# Patient Record
Sex: Male | Born: 1995 | Race: White | Hispanic: No | Marital: Single | Smoking: Never smoker
Health system: Southern US, Community
[De-identification: ages and names within clinical notes are randomized; demographics above are authoritative.]

## PROBLEM LIST (undated history)

## (undated) DIAGNOSIS — L309 Dermatitis, unspecified: Secondary | ICD-10-CM

## (undated) DIAGNOSIS — R300 Dysuria: Secondary | ICD-10-CM

## (undated) HISTORY — DX: Dysuria: R30.0

## (undated) HISTORY — DX: Dermatitis, unspecified: L30.9

---
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
## 2016-10-03 ENCOUNTER — Ambulatory Visit: Payer: BLUE CROSS/BLUE SHIELD | Admitting: Urology

## 2016-10-09 NOTE — Progress Notes (Signed)
10/10/2016 9:35 PM   Jeffrey Larsen 04-13-1995 [id number]  Referring provider: Viviano Simas, FNP [address] [place], [place] [address]  Chief Complaint  Patient presents with  . New Patient (Initial Visit)    Dysuria referred by Viviano Simas FNP    HPI: Patient is a 21 year old Caucasian male who is referred by [name] FNP for intermittent pain with erections and after urination for the last 4 years.  He states that in his junior high school year he had an episode of gross hematuria associated with dysuria. He states that he sought treatment with his primary care physician and was told that he was most likely passing a kidney stone. He never passed a stone and the symptoms abated.   One year ago, he started experience pain with urination.  He was experiencing cloudy urine but did not have gross hematuria.  The pain with urination has become more frequent and more intense.  He was also having pain with erections.  The pain with the erections have since abated about two weeks ago.    His UA today was positive for ketones and protein.  His PVR is 33 mL.    He is not experiencing gross hematuria or suprapubic pain at this time.  He is not having spraying of the urinary stream.  He is not having fevers, chills, nausea or vomiting.    Reviewed referral notes - STI's testing negative  PMH: Past Medical History:  Diagnosis Date  . Dysuria     Surgical History: History reviewed. No pertinent surgical history.  Home Medications:  Allergies as of 10/10/2016      Reactions   Other Itching   pollen   Sulfa Antibiotics       Medication List       Accurate as of 10/10/16 11:59 PM. Always use your most recent med list.          cetirizine 10 MG tablet Commonly known as:  ZYRTEC Take by mouth.   lisdexamfetamine 30 MG capsule Commonly known as:  VYVANSE Take by mouth.   montelukast 10 MG tablet Commonly known as:  SINGULAIR Take 10 mg by mouth at bedtime.     promethazine 25 MG tablet Commonly known as:  PHENERGAN Take 1 tablet (25 mg total) by mouth every 6 (six) hours as needed for nausea or vomiting.       Allergies:  Allergies  Allergen Reactions  . Other Itching    pollen  . Sulfa Antibiotics     Family History: Family History  Problem Relation Age of Onset  . Kidney cancer Father   . Bladder Cancer Neg Hx   . Prostate cancer Neg Hx     Social History:  reports that he has never smoked. He has never used smokeless tobacco. He reports that he drinks alcohol. He reports that he does not use drugs.  ROS: UROLOGY Frequent Urination?: No Hard to postpone urination?: No Burning/pain with urination?: Yes Get up at night to urinate?: No Leakage of urine?: No Urine stream starts and stops?: No Trouble starting stream?: Yes Do you have to strain to urinate?: Yes Blood in urine?: No Urinary tract infection?: No Sexually transmitted disease?: No Injury to kidneys or bladder?: No Painful intercourse?: No Weak stream?: No Erection problems?: No Penile pain?: Yes  Gastrointestinal Nausea?: No Vomiting?: No Indigestion/heartburn?: No Diarrhea?: No Constipation?: No  Constitutional Fever: No Night sweats?: No Weight loss?: No Fatigue?: No  Skin Skin rash/lesions?: No Itching?:  No  Eyes Blurred vision?: No Double vision?: No  Ears/Nose/Throat Sore throat?: No Sinus problems?: No  Hematologic/Lymphatic Swollen glands?: No Easy bruising?: No  Cardiovascular Leg swelling?: No Chest pain?: No  Respiratory Cough?: No Shortness of breath?: No  Endocrine Excessive thirst?: No  Musculoskeletal Back pain?: No Joint pain?: No  Neurological Headaches?: No Dizziness?: No  Psychologic Depression?: No Anxiety?: No  Physical Exam: BP 132/83   Pulse 88   Ht 6' 1.5" (1.867 m)   Wt 218 lb 1.6 oz (98.9 kg)   BMI 28.38 kg/m   Constitutional: Well nourished. Alert and oriented, No acute  distress. HEENT: [place] AT, moist mucus membranes. Trachea midline, no masses. Cardiovascular: No clubbing, cyanosis, or edema. Respiratory: Normal respiratory effort, no increased work of breathing. GI: Abdomen is soft, non tender, non distended, no abdominal masses. Liver and spleen not palpable.  No hernias appreciated.  Stool sample for occult testing is not indicated.   GU: No CVA tenderness.  No bladder fullness or masses.  Patient with circumcised phallus.  Urethral meatus is patent.  No penile discharge. No penile lesions or rashes. Scrotum without lesions, cysts, rashes and/or edema.  Testicles are located scrotally bilaterally. No masses are appreciated in the testicles. Left and right epididymis are normal. Rectal: Patient with  normal sphincter tone. Anus and perineum without scarring or rashes. No rectal masses are appreciated. Prostate is approximately 35 grams, no nodules are appreciated. Seminal vesicles are normal. Skin: No rashes, bruises or suspicious lesions. Lymph: No cervical or inguinal adenopathy. Neurologic: Grossly intact, no focal deficits, moving all 4 extremities. Psychiatric: Normal mood and affect.  Laboratory Data: Urinalysis Microscopically negative.  See EPIC  I have reviewed the labs.   Pertinent Imaging: Results for HUEL, CENTOLA (MRN [id number]) as of 10/20/2016 21:31  Ref. Range 10/10/2016 16:02  Scan Result Unknown 33   I have independently reviewed the films.    Assessment & Plan:    1. Dysuria  - patient with a possible history of nephrolithiasis - will obtain a CT Renal stone study to rule out stone as a cause of urinary symptoms  - RTC for report  2. Painful erections  - resolved for the time being  - will continue to monitor  3. History of hematuria  - CT Renal stone study pending    Return for CT Renal stone study report.  These notes generated with voice recognition software. I apologize for typographical errors.  Jeffrey Cowboy,  PA-C  [facility] [address] [place], [place] [address] [phone]

## 2016-10-10 ENCOUNTER — Ambulatory Visit (INDEPENDENT_AMBULATORY_CARE_PROVIDER_SITE_OTHER): Payer: BLUE CROSS/BLUE SHIELD | Admitting: Urology

## 2016-10-10 ENCOUNTER — Encounter: Payer: Self-pay | Admitting: Urology

## 2016-10-10 VITALS — BP 132/83 | HR 88 | Ht 73.5 in | Wt 218.1 lb

## 2016-10-10 DIAGNOSIS — N4830 Priapism, unspecified: Secondary | ICD-10-CM

## 2016-10-10 DIAGNOSIS — Z87448 Personal history of other diseases of urinary system: Secondary | ICD-10-CM | POA: Diagnosis not present

## 2016-10-10 DIAGNOSIS — R300 Dysuria: Secondary | ICD-10-CM

## 2016-10-10 LAB — URINALYSIS, COMPLETE
Bilirubin, UA: NEGATIVE
GLUCOSE, UA: NEGATIVE
Leukocytes, UA: NEGATIVE
Nitrite, UA: NEGATIVE
Specific Gravity, UA: 1.02 (ref 1.005–1.030)
Urobilinogen, Ur: 0.2 mg/dL (ref 0.2–1.0)
pH, UA: 6.5 (ref 5.0–7.5)

## 2016-10-10 LAB — BLADDER SCAN AMB NON-IMAGING: Scan Result: 33

## 2016-10-16 ENCOUNTER — Telehealth: Payer: Self-pay

## 2016-10-16 LAB — CULTURE, URINE COMPREHENSIVE

## 2016-10-16 NOTE — Telephone Encounter (Signed)
Spoke with pt in reference to ucx results. Pt voiced understanding.  

## 2016-10-16 NOTE — Telephone Encounter (Signed)
-----   Message from [name], PA-C sent at 10/16/2016  7:39 AM EDT ----- Please let Jeffrey Larsen know that his urine culture was negative.

## 2016-10-20 ENCOUNTER — Encounter: Payer: Self-pay | Admitting: Urology

## 2016-10-25 ENCOUNTER — Ambulatory Visit
Admission: RE | Admit: 2016-10-25 | Discharge: 2016-10-25 | Disposition: A | Discharge status: Home / Self Care | Payer: BLUE CROSS/BLUE SHIELD | Source: Ambulatory Visit | Attending: Urology | Admitting: Urology

## 2016-10-25 DIAGNOSIS — Z87448 Personal history of other diseases of urinary system: Secondary | ICD-10-CM | POA: Insufficient documentation

## 2016-10-25 DIAGNOSIS — R300 Dysuria: Secondary | ICD-10-CM | POA: Diagnosis not present

## 2016-10-29 NOTE — Progress Notes (Signed)
10/30/2016 4:37 PM   Maudie Flakes December 05, 1995 [id number]  Referring provider: No referring provider defined for this encounter.  Chief Complaint  Patient presents with  . Results    HPI: 21 yo WM who presents today to discuss his CT Renal stone study report with his mother, Fleet Contras.    Background history Patient is a 21 year old Caucasian male who is referred by [name] FNP for intermittent pain with erections and after urination for the last 4 years.  He states that in his junior high school year he had an episode of gross hematuria associated with dysuria. He sought treatment with his primary care physician and was told that he was most likely passing a kidney stone. He never passed a stone and the symptoms abated.    One year ago, he started experience pain with urination.  He was experiencing cloudy urine but did not have gross hematuria.  These symptoms had been infrequent until recently when the pain with urination became more frequent and more intense.  He was also having pain with erections, but two weeks ago, the pain with the erections abated.  His UA was positive for ketones and protein.  Urine culture was negative.  His PVR was 33 mL.  He is not experiencing gross hematuria or suprapubic pain at this time.  He is not having spraying of the urinary stream.  He is not having fevers, chills, nausea or vomiting.  Reviewed referral notes - STI's testing negative  Today, he states his symptoms remain at baseline.  He has not had gross hematuria or suprapubic pain.  He has not had fevers, chills, nausea or vomiting.   Nothing makes the symptoms worse or better.    CT Renal stone study performed on 10/25/2016 noted no renal or ureteral obstructing stone or hydronephrosis. Slightly lobulated contour of the kidneys. Taking into account limitation by non contrast imaging, no renal or adrenal lesion. Noncontrast filled views of the urinary bladder  Unremarkable.    PMH: Past Medical  History:  Diagnosis Date  . Dysuria   . Eczema     Surgical History: No past surgical history on file.  Home Medications:  Allergies as of 10/30/2016      Reactions   Other Itching   pollen   Sulfa Antibiotics       Medication List       Accurate as of 10/30/16  4:37 PM. Always use your most recent med list.          cetirizine 10 MG tablet Commonly known as:  ZYRTEC Take by mouth.   diazepam 10 MG tablet Commonly known as:  VALIUM Take 30 minutes prior to the cystoscopy   lisdexamfetamine 30 MG capsule Commonly known as:  VYVANSE Take by mouth.   montelukast 10 MG tablet Commonly known as:  SINGULAIR Take 10 mg by mouth at bedtime.   promethazine 25 MG tablet Commonly known as:  PHENERGAN Take 1 tablet (25 mg total) by mouth every 6 (six) hours as needed for nausea or vomiting.       Allergies:  Allergies  Allergen Reactions  . Other Itching    pollen  . Sulfa Antibiotics     Family History: Family History  Problem Relation Age of Onset  . Kidney cancer Father   . Bladder Cancer Neg Hx   . Prostate cancer Neg Hx     Social History:  reports that he has never smoked. He has never used smokeless tobacco. He  reports that he drinks alcohol. He reports that he does not use drugs.  ROS: UROLOGY Frequent Urination?: No Hard to postpone urination?: No Burning/pain with urination?: No Get up at night to urinate?: No Leakage of urine?: No Urine stream starts and stops?: No Trouble starting stream?: No Do you have to strain to urinate?: No Blood in urine?: No Urinary tract infection?: No Sexually transmitted disease?: No Injury to kidneys or bladder?: No Painful intercourse?: No Weak stream?: No Erection problems?: No Penile pain?: Yes  Gastrointestinal Nausea?: No Vomiting?: No Indigestion/heartburn?: No Diarrhea?: No Constipation?: No  Constitutional Fever: No Night sweats?: No Weight loss?: No Fatigue?: No  Skin Skin  rash/lesions?: No Itching?: No  Eyes Blurred vision?: No Double vision?: No  Ears/Nose/Throat Sore throat?: No Sinus problems?: No  Hematologic/Lymphatic Swollen glands?: No Easy bruising?: No  Cardiovascular Leg swelling?: No Chest pain?: No  Respiratory Cough?: No Shortness of breath?: No  Endocrine Excessive thirst?: No  Musculoskeletal Back pain?: No Joint pain?: No  Neurological Headaches?: No Dizziness?: No  Psychologic Depression?: No Anxiety?: No  Physical Exam: There were no vitals taken for this visit.  Constitutional: Well nourished. Alert and oriented, No acute distress. HEENT: [place] AT, moist mucus membranes. Trachea midline, no masses. Cardiovascular: No clubbing, cyanosis, or edema. Respiratory: Normal respiratory effort, no increased work of breathing. Skin: No rashes, bruises or suspicious lesions. Lymph: No cervical or inguinal adenopathy. Neurologic: Grossly intact, no focal deficits, moving all 4 extremities. Psychiatric: Normal mood and affect.  Laboratory Data: Results for orders placed or performed in visit on 10/10/16  CULTURE, URINE COMPREHENSIVE  Result Value Ref Range   Urine Culture, Comprehensive Final report    Organism ID, Bacteria Comment   Urinalysis, Complete  Result Value Ref Range   Specific Gravity, UA 1.020 1.005 - 1.030   pH, UA 6.5 5.0 - 7.5   Color, UA Yellow Yellow   Appearance Ur Clear Clear   Leukocytes, UA Negative Negative   Protein, UA 1+ (A) Negative/Trace   Glucose, UA Negative Negative   Ketones, UA 1+ (A) Negative   RBC, UA Trace (A) Negative   Bilirubin, UA Negative Negative   Urobilinogen, Ur 0.2 0.2 - 1.0 mg/dL   Nitrite, UA Negative Negative  BLADDER SCAN AMB NON-IMAGING  Result Value Ref Range   Scan Result 33    I have reviewed the labs.   Pertinent Imaging: CLINICAL DATA:  21 year old male with remote history of blood in urine and flank pain. Similar symptoms started in March and  are intermittent. Currently no abdominal pain, blood in urine or flank pain. Initial encounter.  EXAM: CT ABDOMEN AND PELVIS WITHOUT CONTRAST  TECHNIQUE: Multidetector CT imaging of the abdomen and pelvis was performed following the standard protocol without IV contrast.  COMPARISON:  None.  FINDINGS: Lower chest: Clear.  Hepatobiliary: Taking into account limitation by non contrast imaging, no worrisome hepatic lesion. No calcified gallstone.  Pancreas: Taking into account limitation by non contrast imaging, no pancreatic mass or inflammation.  Spleen: Taking into account limitation by non contrast imaging, no splenic mass or enlargement.  Adrenals/Urinary Tract: No renal or ureteral obstructing stone or hydronephrosis. Slightly lobulated contour of the kidneys. Taking into account limitation by non contrast imaging, no renal or adrenal lesion. Noncontrast filled views of the urinary bladder unremarkable.  Stomach/Bowel: Portions of stomach and bowel under distended. No extraluminal bowel inflammatory process, free fluid or free air.  Vascular/Lymphatic: No aortic aneurysm. Scattered normal size lymph nodes without  adenopathy.  Reproductive: Negative  Other: No bowel containing hernia.  Musculoskeletal: No worrisome osseous abnormality.  IMPRESSION: No renal or ureteral obstructing stone or evidence hydronephrosis.  Remainder of exam unremarkable as noted above.   Electronically Signed   By: Lacy Duverney M.D.   On: 10/25/2016 16:34  I have independently reviewed the films.    Assessment & Plan:    1. Dysuria  - no etiology found on CT for dysuria  - will schedule cystoscopy to rule out urethral strictures or other etiologies   - Valium 10 mg given to take 30 minutes prior to procedure  - explained to the patient that the cystoscopy consists of passing a tube with a lens up through their urethra and into their urinary bladder.    - We  will inject the urethra with a lidocaine gel prior to introducing the cystoscope to help with any discomfort during the procedure.    - advised the patient that after the procedure, they might experience blood in the urine and discomfort with urination.  This will abate after the first few voids.  I have  encouraged the patient to increase water intake  during this time.  Patient denies any allergies to lidocaine.    2. Painful erections  - resolved for the time being  - will continue to monitor  3. History of hematuria  - CT Renal stone study negative for nephrolithiasis  4. Lobulated contour of the kidneys  - will obtain a RUS for further evaluation  - RTC for report    Return for RUS report and cystoscopy .  These notes generated with voice recognition software. I apologize for typographical errors.  Michiel Cowboy, PA-C  [facility] [address], [address] [place], [place] [address] [phone]

## 2016-10-30 ENCOUNTER — Ambulatory Visit (INDEPENDENT_AMBULATORY_CARE_PROVIDER_SITE_OTHER): Payer: BLUE CROSS/BLUE SHIELD | Admitting: Urology

## 2016-10-30 ENCOUNTER — Encounter: Payer: Self-pay | Admitting: Urology

## 2016-10-30 DIAGNOSIS — R300 Dysuria: Secondary | ICD-10-CM | POA: Diagnosis not present

## 2016-10-30 DIAGNOSIS — Z87448 Personal history of other diseases of urinary system: Secondary | ICD-10-CM | POA: Diagnosis not present

## 2016-10-30 DIAGNOSIS — Q631 Lobulated, fused and horseshoe kidney: Secondary | ICD-10-CM

## 2016-10-30 DIAGNOSIS — N4830 Priapism, unspecified: Secondary | ICD-10-CM | POA: Diagnosis not present

## 2016-10-30 MED ORDER — DIAZEPAM 10 MG PO TABS: ORAL_TABLET | ORAL | 0 refills | Status: DC

## 2016-11-13 ENCOUNTER — Ambulatory Visit
Admission: RE | Admit: 2016-11-13 | Discharge: 2016-11-13 | Disposition: A | Discharge status: Home / Self Care | Payer: BLUE CROSS/BLUE SHIELD | Source: Ambulatory Visit | Attending: Urology | Admitting: Urology

## 2016-11-13 DIAGNOSIS — N2889 Other specified disorders of kidney and ureter: Secondary | ICD-10-CM | POA: Insufficient documentation

## 2016-11-13 DIAGNOSIS — Z87448 Personal history of other diseases of urinary system: Secondary | ICD-10-CM

## 2016-11-22 ENCOUNTER — Encounter: Payer: Self-pay | Admitting: Urology

## 2016-11-22 ENCOUNTER — Ambulatory Visit (INDEPENDENT_AMBULATORY_CARE_PROVIDER_SITE_OTHER): Payer: BLUE CROSS/BLUE SHIELD | Admitting: Urology

## 2016-11-22 VITALS — BP 124/81 | HR 90 | Ht 73.5 in | Wt 223.0 lb

## 2016-11-22 DIAGNOSIS — R300 Dysuria: Secondary | ICD-10-CM | POA: Diagnosis not present

## 2016-11-22 MED ORDER — LIDOCAINE HCL 2 % EX GEL
1.0000 "application " | Freq: Once | CUTANEOUS | Status: AC
  Administered 2016-11-22: 1 via URETHRAL

## 2016-11-22 MED ORDER — CIPROFLOXACIN HCL 500 MG PO TABS
500.0000 mg | ORAL_TABLET | Freq: Once | ORAL | Status: AC
  Administered 2016-11-22: 500 mg via ORAL

## 2016-11-22 NOTE — Progress Notes (Signed)
   11/22/16  CC:  Chief Complaint  Patient presents with  . Cysto    HPI: Refer to office note of 10/30/2016.  Follow-up renal ultrasound showed no renal masses.  Blood pressure 124/81, pulse 90, height 6' 1.5" (1.867 m), weight 223 lb (101.2 kg). NED. A&Ox3.   No respiratory distress   Abd soft, NT, ND Normal phallus with bilateral descended testicles  Cystoscopy Procedure Note  Patient identification was confirmed, informed consent was obtained, and patient was prepped using Betadine solution.  Lidocaine jelly was administered per urethral meatus.    Preoperative abx where received prior to procedure.     Pre-Procedure: - Inspection reveals a normal caliber ureteral meatus.  Procedure: The flexible cystoscope was introduced without difficulty - No urethral strictures/lesions are present. - Normal prostate  - Normal bladder neck - Bilateral ureteral orifices identified - Bladder mucosa  reveals no ulcers, tumors, or lesions - No bladder stones - No trabeculation  Retroflexion shows no abnormalities   Post-Procedure: - Patient tolerated the procedure well  Assessment/ Plan:  No evidence of urethral stricture.  Follow-up as needed for any significant change in symptoms

## 2016-11-23 LAB — MICROSCOPIC EXAMINATION
Bacteria, UA: NONE SEEN
EPITHELIAL CELLS (NON RENAL): NONE SEEN /HPF (ref 0–10)
WBC, UA: NONE SEEN /hpf (ref 0–?)

## 2016-11-23 LAB — URINALYSIS, COMPLETE
BILIRUBIN UA: NEGATIVE
Glucose, UA: NEGATIVE
Ketones, UA: NEGATIVE
Leukocytes, UA: NEGATIVE
NITRITE UA: NEGATIVE
PH UA: 6.5 (ref 5.0–7.5)
Specific Gravity, UA: 1.025 (ref 1.005–1.030)
UUROB: 0.2 mg/dL (ref 0.2–1.0)

## 2017-03-25 IMAGING — CR DG CHEST 2V
1 series · 2 of 2 positions shown · non-contrast
Comparison: None.

CLINICAL DATA: Cough

EXAM:
CHEST  2 VIEW

[Series 1: dg chest 2 view · 0.14mm/px · 2 of 2 slices shown]
[im 1/2]
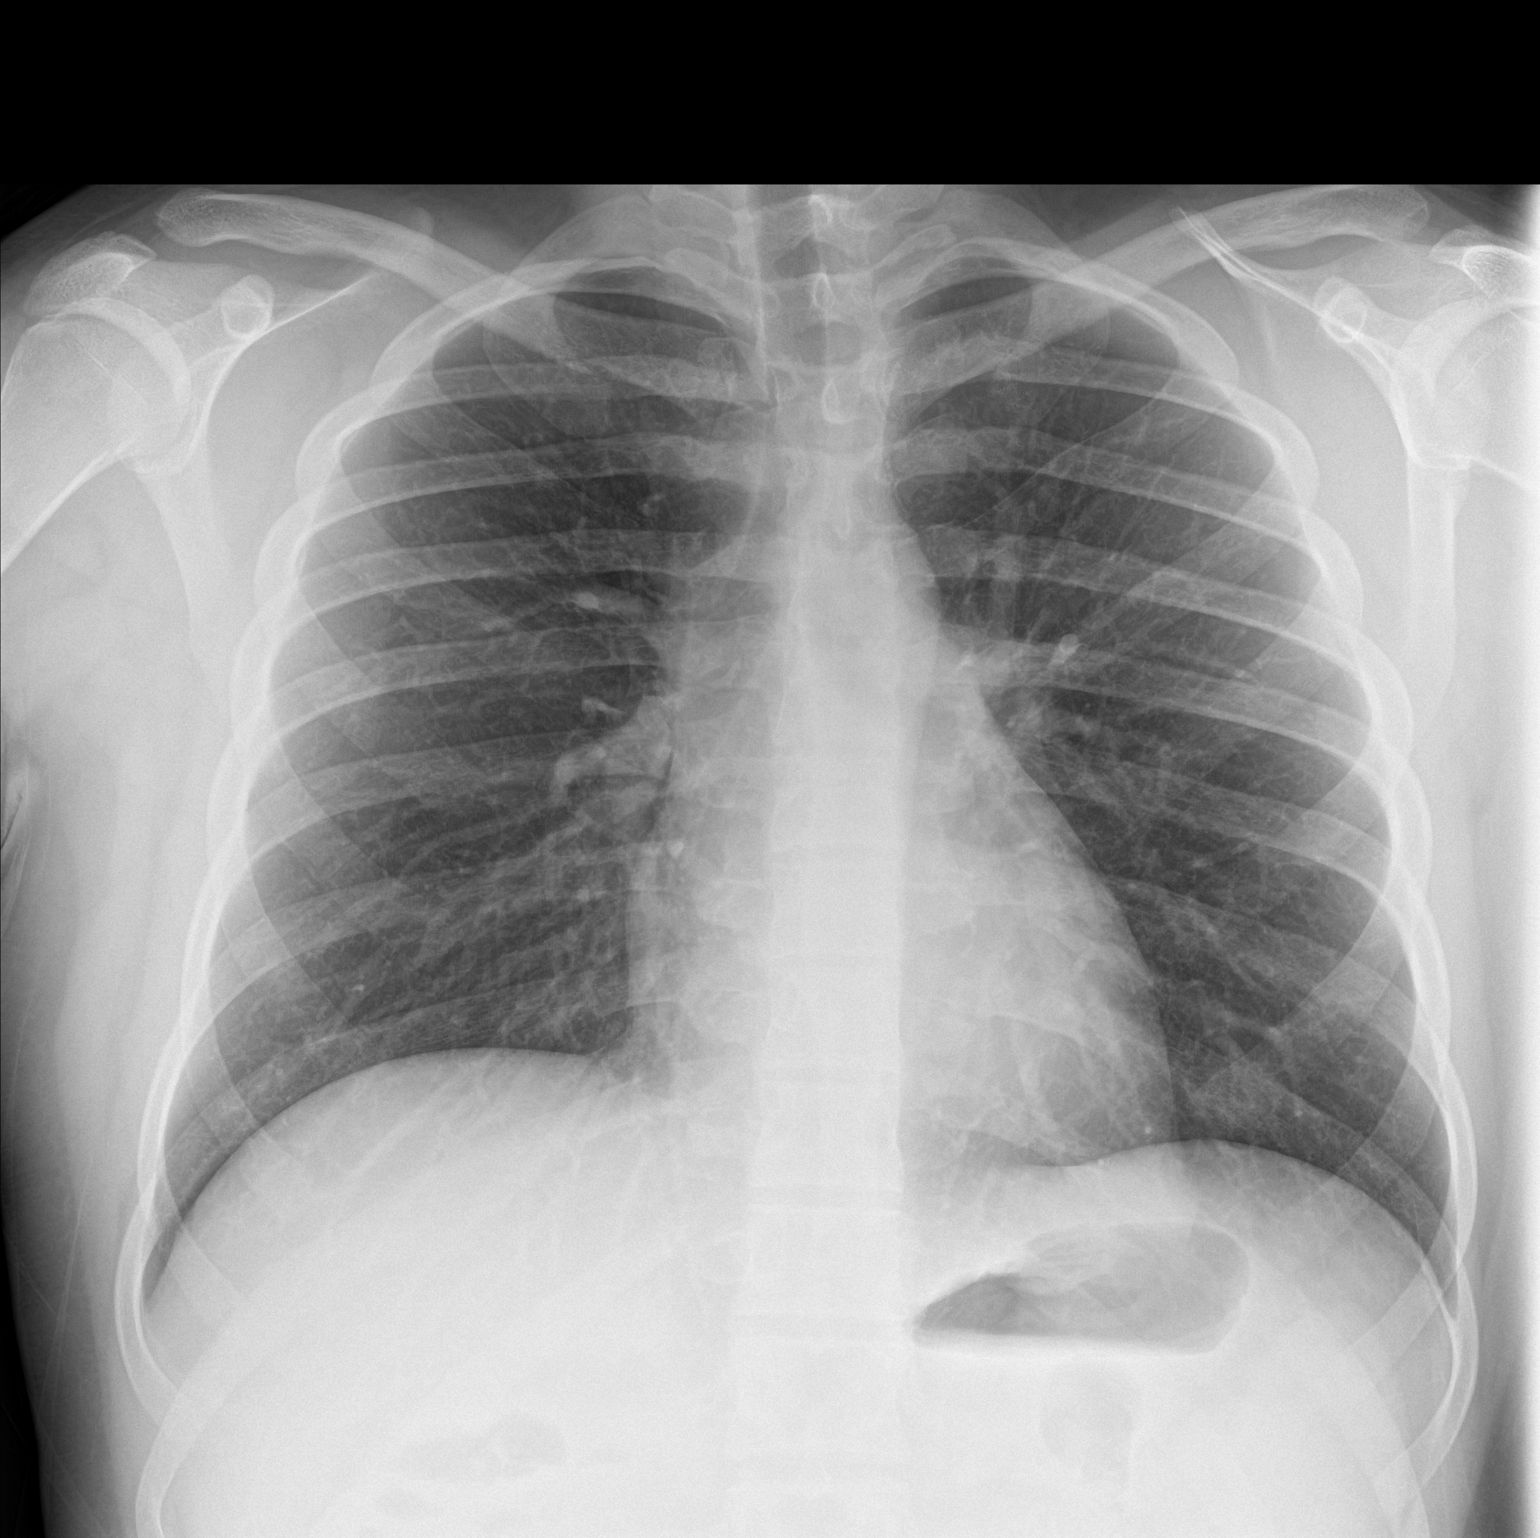
[im 2/2]
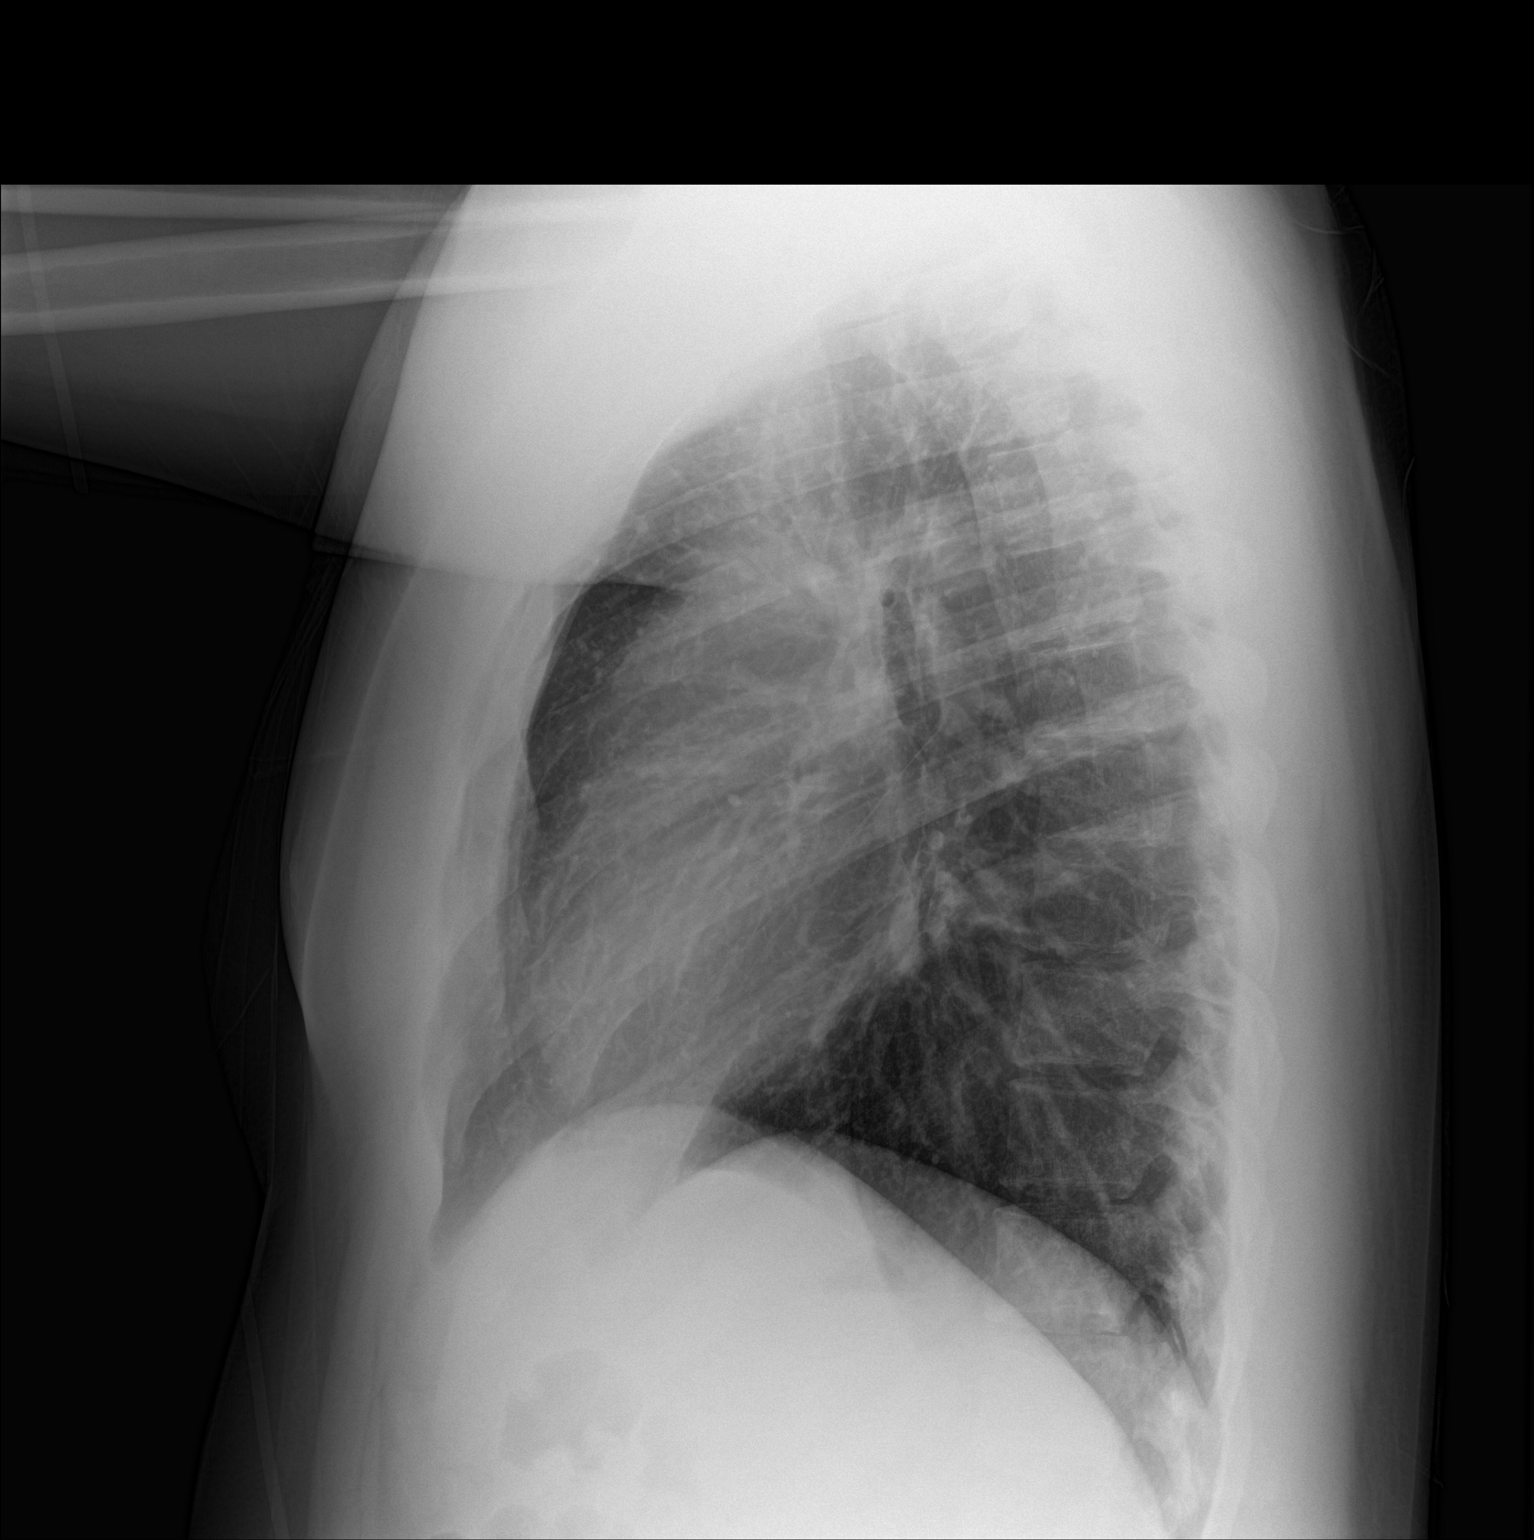

[2 of 2 positions shown; findings below may reference images not displayed]

FINDINGS: Normal heart size. Normal mediastinal contour. No pneumothorax. No
pleural effusion. Lungs appear clear, with no acute consolidative
airspace disease and no pulmonary edema.
IMPRESSION: No active cardiopulmonary disease.

## 2017-11-12 ENCOUNTER — Encounter: Payer: Self-pay | Admitting: Urology

## 2017-11-12 ENCOUNTER — Ambulatory Visit (INDEPENDENT_AMBULATORY_CARE_PROVIDER_SITE_OTHER): Payer: BLUE CROSS/BLUE SHIELD | Admitting: Urology

## 2017-11-12 VITALS — BP 123/76 | HR 84 | Ht 74.0 in | Wt 227.5 lb

## 2017-11-12 DIAGNOSIS — Z87448 Personal history of other diseases of urinary system: Secondary | ICD-10-CM

## 2017-11-12 DIAGNOSIS — R300 Dysuria: Secondary | ICD-10-CM | POA: Diagnosis not present

## 2017-11-12 LAB — URINALYSIS, COMPLETE
BILIRUBIN UA: NEGATIVE
Glucose, UA: NEGATIVE
Ketones, UA: NEGATIVE
Leukocytes, UA: NEGATIVE
Nitrite, UA: NEGATIVE
PH UA: 7 (ref 5.0–7.5)
Protein, UA: NEGATIVE
RBC, UA: NEGATIVE
Specific Gravity, UA: 1.015 (ref 1.005–1.030)
UUROB: 0.2 mg/dL (ref 0.2–1.0)

## 2017-11-12 MED ORDER — TAMSULOSIN HCL 0.4 MG PO CAPS: 0.4000 mg | ORAL_CAPSULE | Freq: Every day | ORAL | 1 refills | Status: DC

## 2017-11-12 NOTE — Progress Notes (Signed)
11/12/2017 3:13 PM   Maudie Flakes 11-23-1995 [id number]  Referring provider: No referring provider defined for this encounter.  Chief Complaint  Patient presents with  . Dysuria    HPI: 22 year old male who saw Carollee Herter approximately 1 year ago with complaints of dysuria, urinary frequency and painful erections.  He had a negative CT and renal ultrasound.  Cystoscopy showed no evidence of urethral stricture and no bladder abnormalities.  He recently had recurrent dysuria which was associated with urinary hesitancy, decreased stream and straining to urinate.  He denies gross hematuria.  He had had an episode of unprotected sexual intercourse and was seen at [facility] and had negative testing for UTI/STI.  His symptoms have improved.  Urology follow-up was recommended.  He notes occasional pain in his scrotum and perineal area.   PMH: Past Medical History:  Diagnosis Date  . Dysuria   . Eczema     Surgical History: No past surgical history on file.  Home Medications:  Allergies as of 11/12/2017      Reactions   Other Itching   pollen   Sulfa Antibiotics       Medication List        Accurate as of 11/12/17  3:13 PM. Always use your most recent med list.          lisdexamfetamine 30 MG capsule Commonly known as:  VYVANSE Take by mouth.   montelukast 10 MG tablet Commonly known as:  SINGULAIR Take 10 mg by mouth at bedtime.       Allergies:  Allergies  Allergen Reactions  . Other Itching    pollen  . Sulfa Antibiotics     Family History: Family History  Problem Relation Age of Onset  . Kidney cancer Father   . Bladder Cancer Neg Hx   . Prostate cancer Neg Hx     Social History:  reports that he has never smoked. He has never used smokeless tobacco. He reports that he drinks alcohol. He reports that he does not use drugs.  ROS: UROLOGY Frequent Urination?: No Hard to postpone urination?: No Burning/pain with urination?: Yes Get up at night  to urinate?: No Leakage of urine?: No Urine stream starts and stops?: No Trouble starting stream?: Yes Do you have to strain to urinate?: Yes Blood in urine?: No Urinary tract infection?: No Sexually transmitted disease?: No Injury to kidneys or bladder?: No Painful intercourse?: No Weak stream?: No Erection problems?: No Penile pain?: No  Gastrointestinal Nausea?: No Vomiting?: No Indigestion/heartburn?: No Diarrhea?: No Constipation?: No  Constitutional Fever: No Night sweats?: No Weight loss?: No Fatigue?: No  Skin Skin rash/lesions?: No Itching?: No  Eyes Blurred vision?: No Double vision?: No  Ears/Nose/Throat Sore throat?: No Sinus problems?: No  Hematologic/Lymphatic Swollen glands?: No Easy bruising?: No  Cardiovascular Leg swelling?: No Chest pain?: No  Respiratory Cough?: No Shortness of breath?: No  Endocrine Excessive thirst?: No  Musculoskeletal Back pain?: No Joint pain?: No  Neurological Headaches?: No Dizziness?: No  Psychologic Depression?: No Anxiety?: No  Physical Exam: BP 123/76 (BP Location: Left Arm, Patient Position: Sitting, Cuff Size: Large)   Pulse 84   Ht 6\' 2"  (1.88 m)   Wt 227 lb 8 oz (103.2 kg)   BMI 29.21 kg/m   Constitutional:  Alert and oriented, No acute distress. HEENT: [place] AT, moist mucus membranes.  Trachea midline, no masses. Cardiovascular: No clubbing, cyanosis, or edema. Respiratory: Normal respiratory effort, no increased work of breathing. Skin: No rashes,  bruises or suspicious lesions. Neurologic: Grossly intact, no focal deficits, moving all 4 extremities. Psychiatric: Normal mood and affect.  Laboratory Data:  Urinalysis Dipstick/microscopy negative   Assessment & Plan:   22 year old male with recurrent dysuria and mild obstructive symptoms including hesitancy, decreased stream.  His symptoms have improved.  Previous cystoscopy and upper tract imaging was negative.  His symptoms may be  indicative of prostatic inflammation.  Urinalysis today was unremarkable.  Recommended a trial of tamsulosin 0.4 mg daily.  If this is beneficial would recommend he take this as needed for his symptoms.  Follow-up for persistent or worsening symptoms.   Riki Altes, MD  [facility] [address] [place], [place] [address] [phone]

## 2017-11-19 ENCOUNTER — Telehealth: Payer: Self-pay

## 2017-11-19 ENCOUNTER — Ambulatory Visit: Payer: BLUE CROSS/BLUE SHIELD | Admitting: Urology

## 2017-11-19 NOTE — Telephone Encounter (Signed)
I called and spoke with pt about upcoming appointment. He stated that he wanted to address a concern about redness around the meatus during erections, specifically when masturbating or engaging in sexual intercourse. Patient states that once flaccid, the redness is gone. He states that he typically always uses contraception during intercourse. I advised patient that he this could be a sensitivity issue and not a medical related issue. I advised pt to change his condom preference and try a lambskin condom for sensitivity. He is to continue taking Tamsulosin and follow up if symptoms worsen. Pt states understanding and will try alternative contraceptive brands.

## 2017-11-19 NOTE — Telephone Encounter (Signed)
-----   Message from Riki AltesScott C Stoioff, MD sent at 11/19/2017  7:11 AM EST ----- I just saw this patient last week and he was started on tamsulosin.  There is nothing I am going to change at this point.  His work-up is been negative.  He needs to take the tamsulosin for at least 1 month.  If he wants to be seen can change him to a nurse visit to check a urinalysis.

## 2017-12-26 ENCOUNTER — Ambulatory Visit (INDEPENDENT_AMBULATORY_CARE_PROVIDER_SITE_OTHER): Payer: BLUE CROSS/BLUE SHIELD | Admitting: Urology

## 2017-12-26 ENCOUNTER — Encounter: Payer: Self-pay | Admitting: Urology

## 2017-12-26 VITALS — BP 111/77 | HR 98 | Ht 74.0 in | Wt 228.8 lb

## 2017-12-26 DIAGNOSIS — R300 Dysuria: Secondary | ICD-10-CM

## 2017-12-26 LAB — URINALYSIS, COMPLETE
Bilirubin, UA: NEGATIVE
GLUCOSE, UA: NEGATIVE
Ketones, UA: NEGATIVE
Leukocytes, UA: NEGATIVE
NITRITE UA: NEGATIVE
PH UA: 6 (ref 5.0–7.5)
Protein, UA: NEGATIVE
RBC UA: NEGATIVE
Specific Gravity, UA: >1.03 — ABNORMAL HIGH (ref 1.005–1.030)
Urobilinogen, Ur: 0.2 mg/dL (ref 0.2–1.0)

## 2017-12-26 LAB — MICROSCOPIC EXAMINATION
Epithelial Cells (non renal): NONE SEEN /hpf (ref 0–10)
RBC MICROSCOPIC, UA: NONE SEEN /HPF (ref 0–2)

## 2017-12-26 NOTE — Progress Notes (Signed)
12/26/2017 9:30 AM   Jeffrey Larsen 06/09/1995 [id number]  Referring provider: No referring provider defined for this encounter.  Chief Complaint  Patient presents with  . Dysuria    HPI: 22 year old male presents for follow-up.  I saw him last month for dysuria.  He has seen Michiel CowboyShannon McGowan in the past for vague symptoms of intermittent pain with erections and dysuria that has persisted for approximately 5 years.  His evaluation has included a negative renal ultrasound, stone protocol CT and cystoscopy.  Urinalysis has been normal and STI testing negative.  He was given a trial of tamsulosin at his last visit which she states helped his symptoms slightly.  He has completed this medication.  He also noted red areas on his penis that have been concerning.   PMH: Past Medical History:  Diagnosis Date  . Dysuria   . Eczema     Surgical History: No past surgical history on file.  Home Medications:  Allergies as of 12/26/2017      Reactions   Other Itching   pollen   Sulfa Antibiotics       Medication List       Accurate as of December 26, 2017  9:30 AM. Always use your most recent med list.        lisdexamfetamine 30 MG capsule Commonly known as:  VYVANSE Take by mouth.   montelukast 10 MG tablet Commonly known as:  SINGULAIR Take 10 mg by mouth at bedtime.       Allergies:  Allergies  Allergen Reactions  . Other Itching    pollen  . Sulfa Antibiotics     Family History: Family History  Problem Relation Age of Onset  . Kidney cancer Father   . Bladder Cancer Neg Hx   . Prostate cancer Neg Hx     Social History:  reports that he has never smoked. He has never used smokeless tobacco. He reports current alcohol use. He reports that he does not use drugs.  ROS: UROLOGY Frequent Urination?: No Hard to postpone urination?: No Burning/pain with urination?: No Get up at night to urinate?: No Leakage of urine?: No Urine stream starts and stops?:  No Trouble starting stream?: No Do you have to strain to urinate?: No Blood in urine?: No Urinary tract infection?: No Sexually transmitted disease?: No Injury to kidneys or bladder?: No Painful intercourse?: No Weak stream?: No Erection problems?: Yes Penile pain?: No  Gastrointestinal Nausea?: No Vomiting?: No Indigestion/heartburn?: No Diarrhea?: No Constipation?: No  Constitutional Fever: No Night sweats?: No Weight loss?: No Fatigue?: No  Skin Skin rash/lesions?: No Itching?: No  Eyes Blurred vision?: No Double vision?: No  Ears/Nose/Throat Sore throat?: No Sinus problems?: No  Hematologic/Lymphatic Swollen glands?: No Easy bruising?: No  Cardiovascular Leg swelling?: No Chest pain?: No  Respiratory Cough?: No Shortness of breath?: No  Endocrine Excessive thirst?: No  Musculoskeletal Back pain?: No Joint pain?: No  Neurological Headaches?: No Dizziness?: No  Psychologic Depression?: No Anxiety?: No  Physical Exam: BP 111/77 (BP Location: Left Arm, Patient Position: Sitting, Cuff Size: Large)   Pulse 98   Ht 6\' 2"  (1.88 m)   Wt 228 lb 12.8 oz (103.8 kg)   BMI 29.38 kg/m   Constitutional:  Alert and oriented, No acute distress. HEENT: [place] AT, moist mucus membranes.  Trachea midline, no masses. Cardiovascular: No clubbing, cyanosis, or edema. Respiratory: Normal respiratory effort, no increased work of breathing. GI: Abdomen is soft, nontender, nondistended, no abdominal masses GU:  No CVA tenderness.  Penis is circumcised.  No erythema or lesions noted.  He states the previously noted reddened areas on his glands have resolved.  Prostate 25 g, nontender.  No pelvic floor tenderness. Lymph: No cervical or inguinal lymphadenopathy. Skin: No rashes, bruises or suspicious lesions. Neurologic: Grossly intact, no focal deficits, moving all 4 extremities. Psychiatric: Normal mood and affect.  Laboratory  Data:  Urinalysis Dipstick/microscopy negative   Assessment & Plan:   22 year old male with vague pelvic symptoms and intermittent dysuria.  He has had extensive evaluation to date which has been negative.  No significant pelvic floor tenderness and it is unlikely he would benefit from pelvic floor PT.  Urinalysis today was clear.  He was reassured that no serious findings have been identified on his evaluation.  I did ask him to try and relate worsening symptoms to dietary triggers.  He will follow-up as needed.   Return if symptoms worsen or fail to improve.  Riki AltesScott C Stoioff, MD  Greater than 50% of this 15-minute visit was spent counseling the patient.   [facility] [address], [address] [place], [place] [address] [phone]

## 2018-03-11 ENCOUNTER — Other Ambulatory Visit: Payer: Self-pay

## 2018-03-11 ENCOUNTER — Encounter: Payer: Self-pay | Admitting: Urology

## 2018-03-11 ENCOUNTER — Ambulatory Visit (INDEPENDENT_AMBULATORY_CARE_PROVIDER_SITE_OTHER): Payer: BLUE CROSS/BLUE SHIELD | Admitting: Urology

## 2018-03-11 VITALS — BP 133/92 | HR 80 | Ht 74.0 in | Wt 229.0 lb

## 2018-03-11 DIAGNOSIS — G8929 Other chronic pain: Secondary | ICD-10-CM | POA: Diagnosis not present

## 2018-03-11 DIAGNOSIS — R300 Dysuria: Secondary | ICD-10-CM

## 2018-03-11 DIAGNOSIS — R102 Pelvic and perineal pain: Secondary | ICD-10-CM | POA: Diagnosis not present

## 2018-03-11 LAB — URINALYSIS, COMPLETE
BILIRUBIN UA: NEGATIVE
GLUCOSE, UA: NEGATIVE
KETONES UA: NEGATIVE
Leukocytes, UA: NEGATIVE
Nitrite, UA: NEGATIVE
PROTEIN UA: NEGATIVE
RBC UA: NEGATIVE
Urobilinogen, Ur: 0.2 mg/dL (ref 0.2–1.0)
pH, UA: 7 (ref 5.0–7.5)

## 2018-03-11 MED ORDER — AMITRIPTYLINE HCL 25 MG PO TABS: 25.0000 mg | ORAL_TABLET | Freq: Every day | ORAL | 0 refills | Status: AC

## 2018-03-11 NOTE — Progress Notes (Signed)
03/11/2018 9:08 AM   Jeffrey Larsen Jun 14, 1995 [id number]  Referring provider: No referring provider defined for this encounter.  Chief Complaint  Patient presents with  . Dysuria    HPI: 23 year old male with chronic pelvic symptoms including intermittent pain with erections and dysuria that has been intermittent for 5 years.  His evaluation has been negative including a normal renal ultrasound, noncontrast CT of the abdomen and pelvis and cystoscopy.  Urinalyses and STI testing has been negative.  He was given a trial of tamsulosin with mild improvement in his symptoms.  He had no significant pelvic floor tenderness on rectal exam.  I last saw him in December 2019 and he was to follow-up as needed.  He states he traveled abroad earlier this year and did develop recurrent dysuria.  He also complains of dull groin discomfort and penile pain.  He was seen at [facility] and treated with a course of doxycycline for nongonococcal urethritis.  He states his symptoms have resolved however his groin discomfort has returned.   PMH: Past Medical History:  Diagnosis Date  . Dysuria   . Eczema     Surgical History: No past surgical history on file.  Home Medications:  Allergies as of 03/11/2018      Reactions   Other Itching   pollen   Sulfa Antibiotics       Medication List       Accurate as of March 11, 2018  9:08 AM. Always use your most recent med list.        lisdexamfetamine 30 MG capsule Commonly known as:  VYVANSE Take by mouth.   montelukast 10 MG tablet Commonly known as:  SINGULAIR Take 10 mg by mouth at bedtime.       Allergies:  Allergies  Allergen Reactions  . Other Itching    pollen  . Sulfa Antibiotics     Family History: Family History  Problem Relation Age of Onset  . Kidney cancer Father   . Bladder Cancer Neg Hx   . Prostate cancer Neg Hx     Social History:  reports that he has never smoked. He has never used smokeless  tobacco. He reports current alcohol use. He reports that he does not use drugs.  ROS: UROLOGY Frequent Urination?: No Hard to postpone urination?: No Burning/pain with urination?: Yes Get up at night to urinate?: No Leakage of urine?: No Urine stream starts and stops?: No Trouble starting stream?: Yes Do you have to strain to urinate?: Yes Blood in urine?: No Urinary tract infection?: No Sexually transmitted disease?: No Injury to kidneys or bladder?: No Painful intercourse?: No Weak stream?: No Erection problems?: No Penile pain?: Yes  Gastrointestinal Nausea?: No Vomiting?: No Indigestion/heartburn?: No Diarrhea?: No Constipation?: No  Constitutional Fever: No Night sweats?: No Weight loss?: No Fatigue?: No  Skin Skin rash/lesions?: No Itching?: No  Eyes Blurred vision?: No Double vision?: No  Ears/Nose/Throat Sore throat?: No Sinus problems?: No  Hematologic/Lymphatic Swollen glands?: No Easy bruising?: No  Cardiovascular Leg swelling?: No Chest pain?: No  Respiratory Cough?: No Shortness of breath?: No  Endocrine Excessive thirst?: No  Musculoskeletal Back pain?: No Joint pain?: No  Neurological Headaches?: No Dizziness?: No  Psychologic Depression?: No Anxiety?: No  Physical Exam: BP (!) 133/92   Pulse 80   Ht 6\' 2"  (1.88 m)   Wt 229 lb (103.9 kg)   BMI 29.40 kg/m   Constitutional:  Alert and oriented, No acute distress. HEENT: [place]  AT, moist mucus membranes.  Trachea midline, no masses. Cardiovascular: No clubbing, cyanosis, or edema. Respiratory: Normal respiratory effort, no increased work of breathing. Skin: No rashes, bruises or suspicious lesions. Neurologic: Grossly intact, no focal deficits, moving all 4 extremities. Psychiatric: Normal mood and affect.  Laboratory Data:  Urinalysis Dipstick/microscopy negative   Assessment & Plan:   23 year old male with a long history of chronic pelvic discomfort, penile pain  and dysuria.  I again reviewed with him all of his testing has been negative.  Urinalysis today was clear.  He was interested in obtaining a second urologic opinion.  In the meantime we will give a trial of low-dose amitriptyline 25 mg at bedtime.   Riki Altes, MD  [facility] [address] [place], [place] [address] [phone]

## 2019-10-04 IMAGING — US US RENAL
1 series · 14 of 25 positions shown · non-contrast
Comparison: CT 10/25/2016

CLINICAL DATA: Hematuria, lobulated kidney contour

EXAM:
RENAL / URINARY TRACT ULTRASOUND COMPLETE

[Series 1: us renal · 0.23mm/px · 14 of 35 slices shown]
[im 1/35]
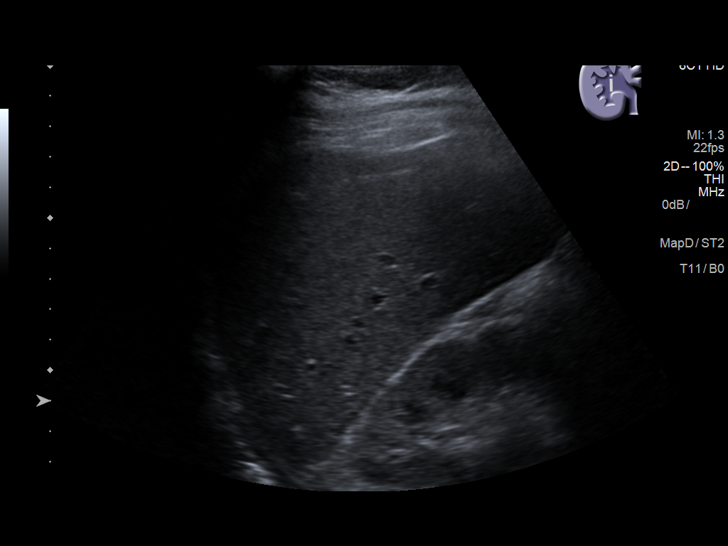
[im 3/35]
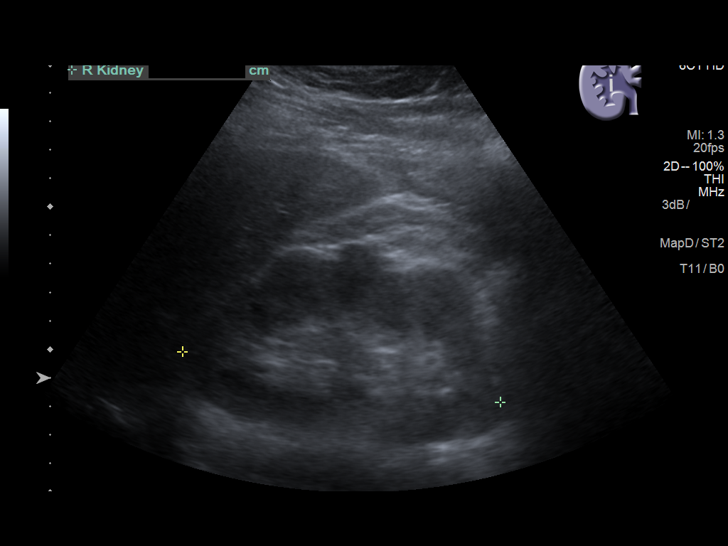
[im 6/35]
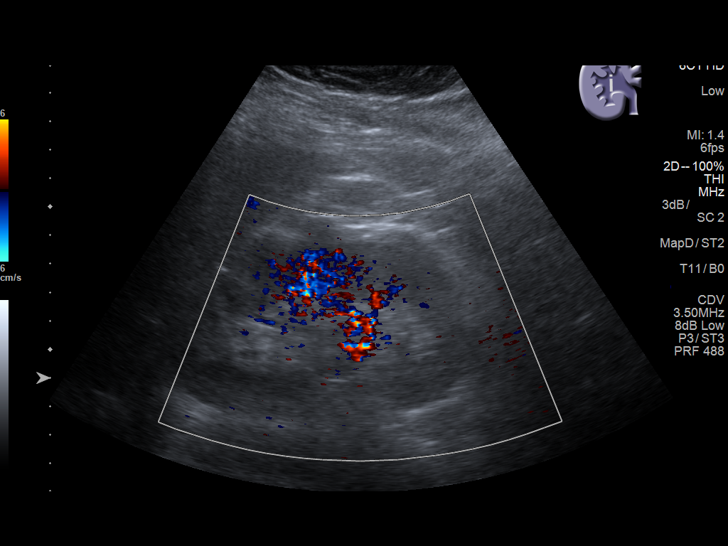
[im 9/35]
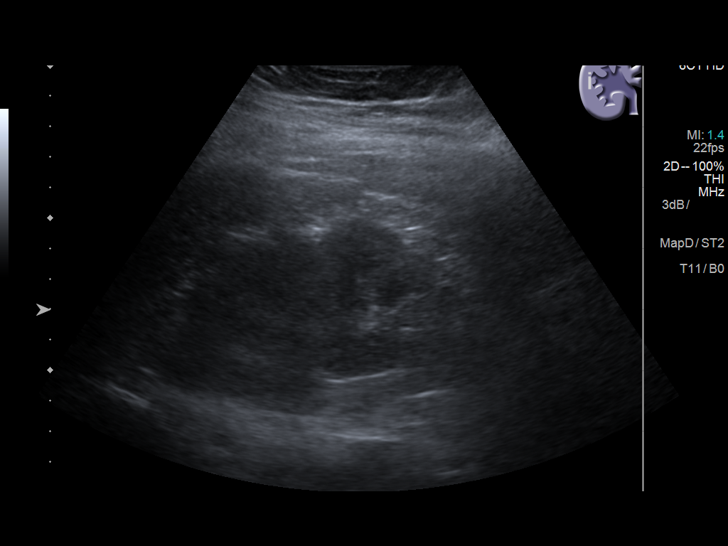
[im 12/35]
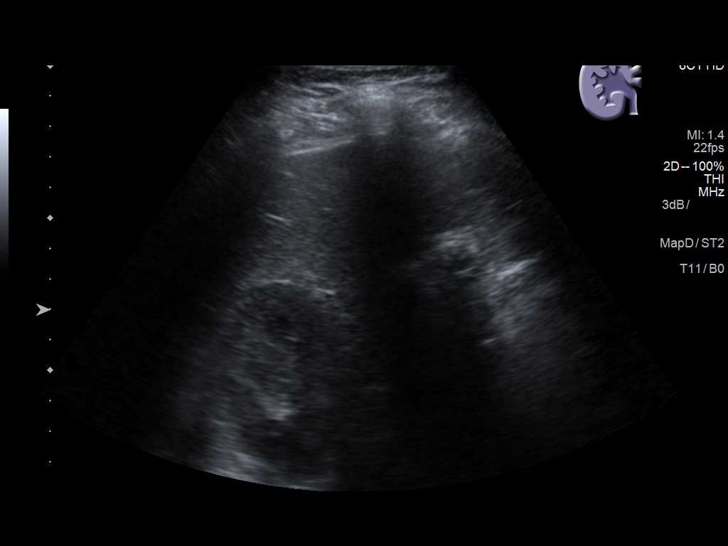
[im 13/35]
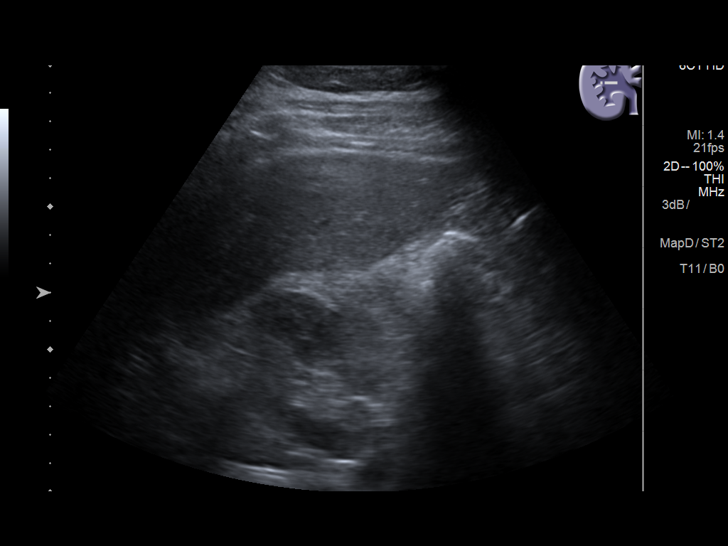
[im 16/35]
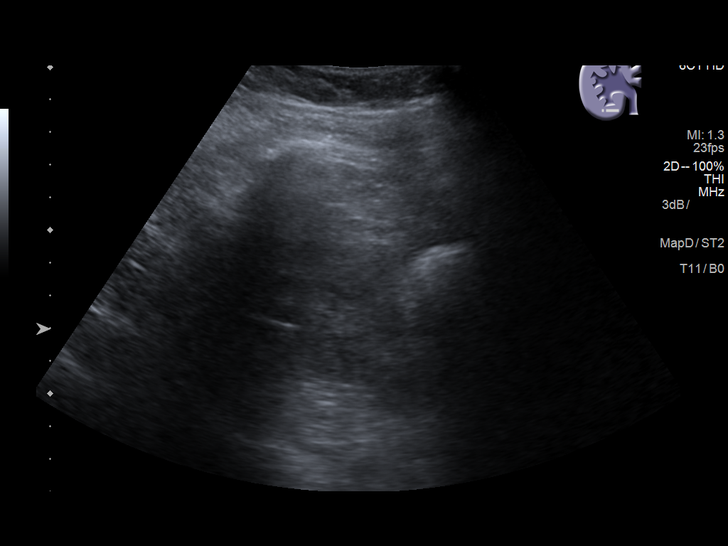
[im 19/35]
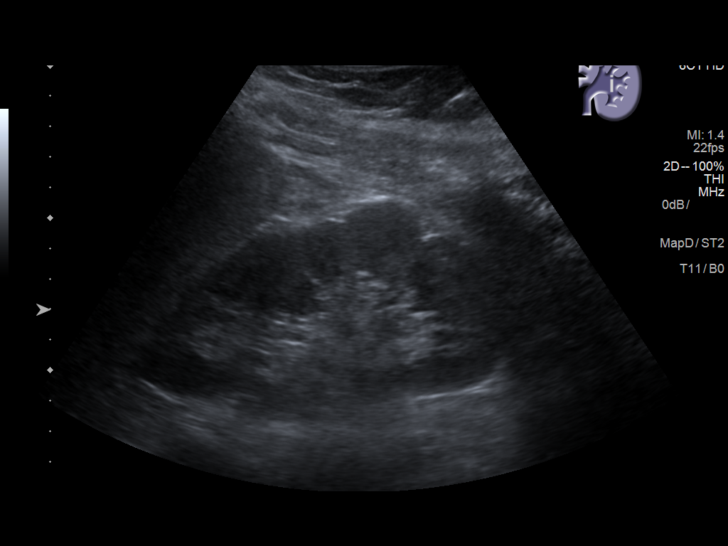
[im 22/35]
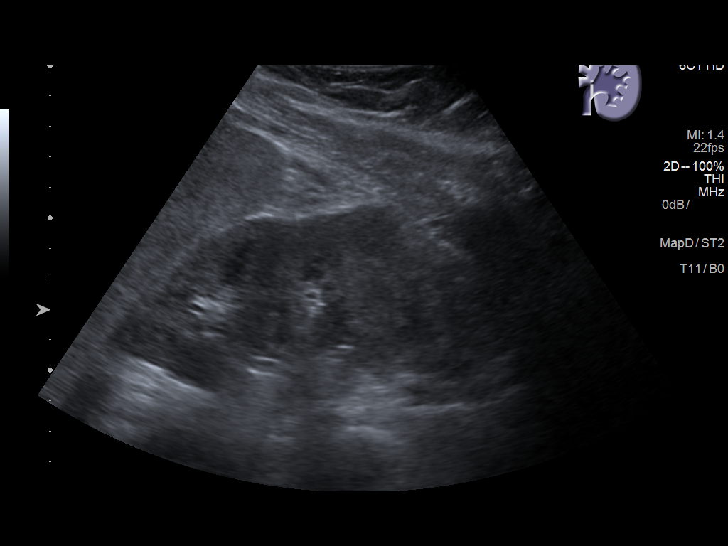
[im 23/35]
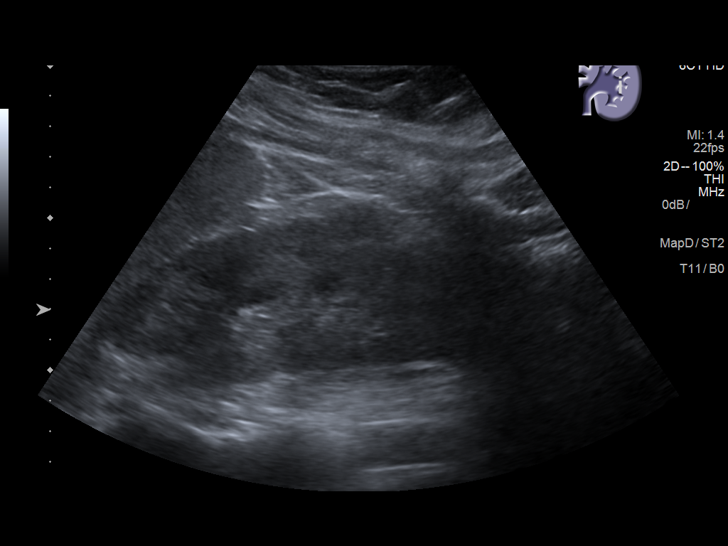
[im 26/35]
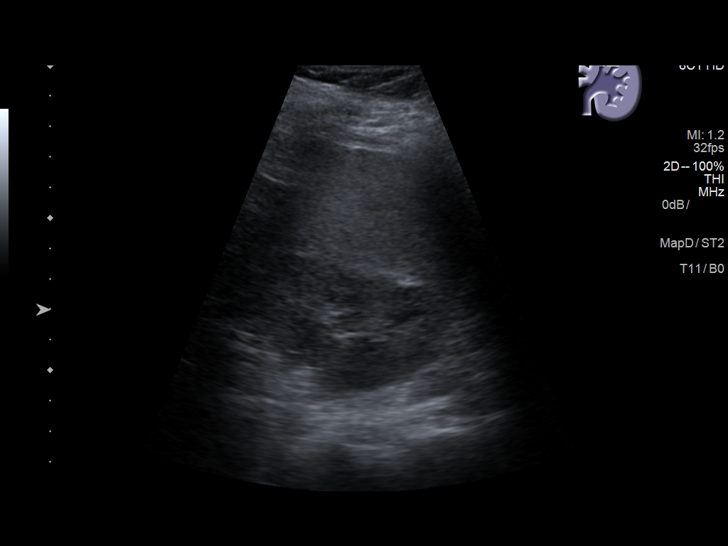
[im 29/35]
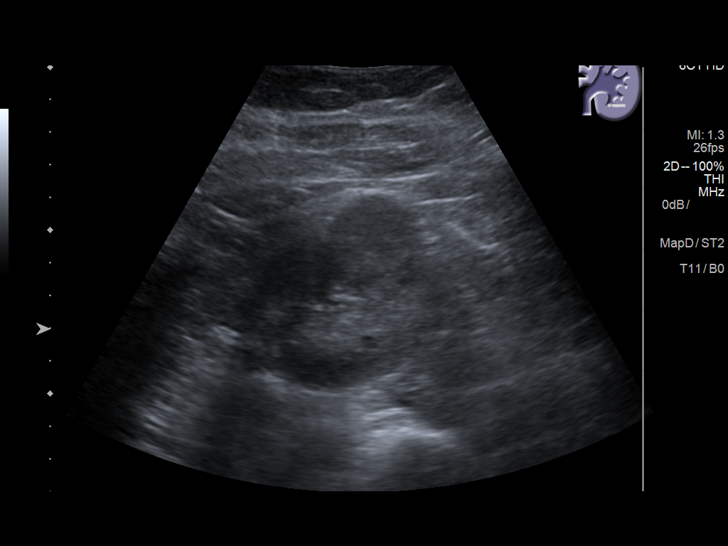
[im 32/35]
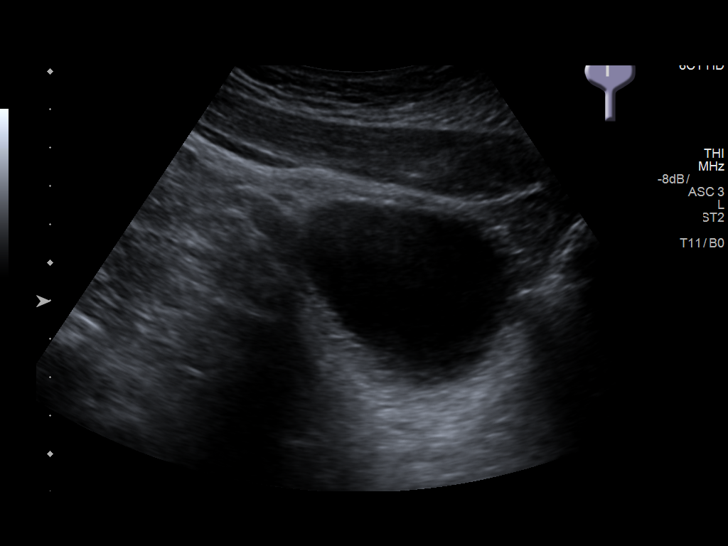
[im 35/35]
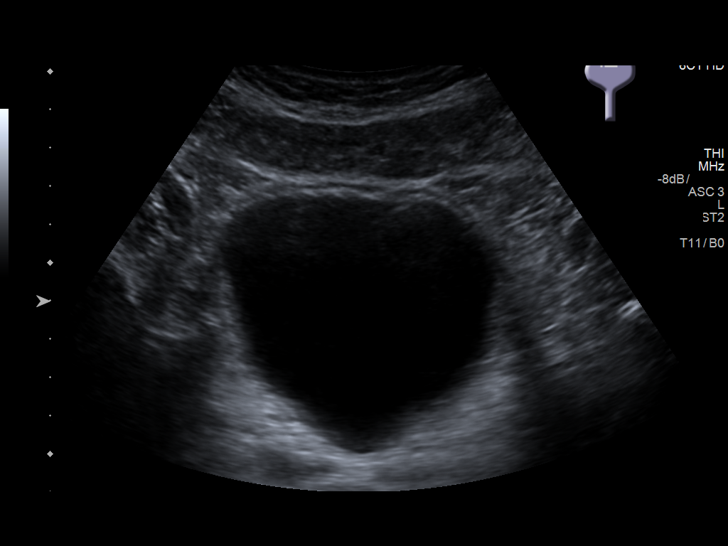

[14 of 25 positions shown; findings below may reference images not displayed]

FINDINGS: Right Kidney:

Length: 11.3 cm. Echogenicity within normal limits. No mass or
hydronephrosis visualized.

Left Kidney:

Length: 12.4 cm. Echogenicity within normal limits. No mass or
hydronephrosis visualized.

Bladder:

Appears normal for degree of bladder distention.
IMPRESSION: Lobulated contour of the kidneys but no evidence for hydronephrosis
or mass.
